# Patient Record
Sex: Female | Born: 2000 | Race: White | Hispanic: No | Marital: Single | Smoking: Never smoker
Health system: Southern US, Community
[De-identification: ages and names within clinical notes are randomized; demographics above are authoritative.]

## PROBLEM LIST (undated history)

## (undated) DIAGNOSIS — H539 Unspecified visual disturbance: Secondary | ICD-10-CM

## (undated) DIAGNOSIS — F419 Anxiety disorder, unspecified: Secondary | ICD-10-CM

## (undated) DIAGNOSIS — T7840XA Allergy, unspecified, initial encounter: Secondary | ICD-10-CM

---
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
## 2020-03-25 IMAGING — CT CT RENAL STONE PROTOCOL
2 of 4 series · 16 of 46 positions shown, 18 images · non-contrast
Comparison: None.

CLINICAL DATA: Left flank pain, antibiotics for UTI

EXAM:
CT ABDOMEN AND PELVIS WITHOUT CONTRAST
TECHNIQUE: Multidetector CT imaging of the abdomen and pelvis was performed
following the standard protocol without IV contrast.

[Series 3: axial st · axial · 0.64mm/px · z∈[-420,-54]mm · 13 of 84 slices shown, 15 images]
[im 7/84  soft-tissue]
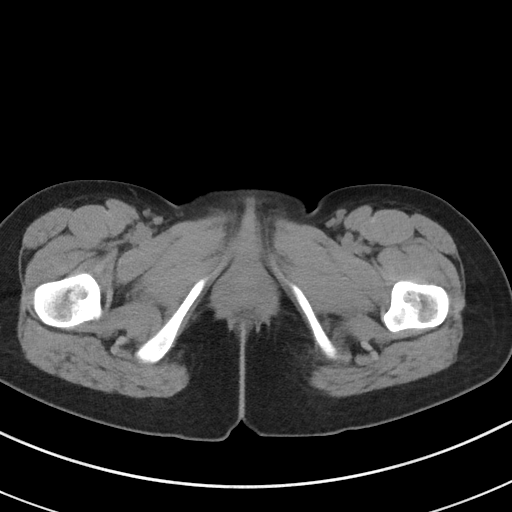
[im 7/84  bone]
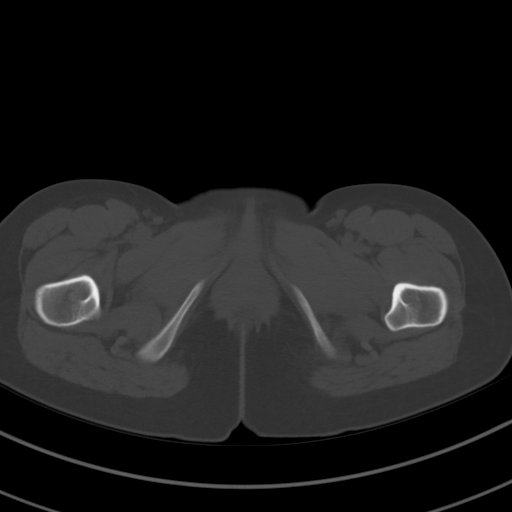
[im 13/84  soft-tissue]
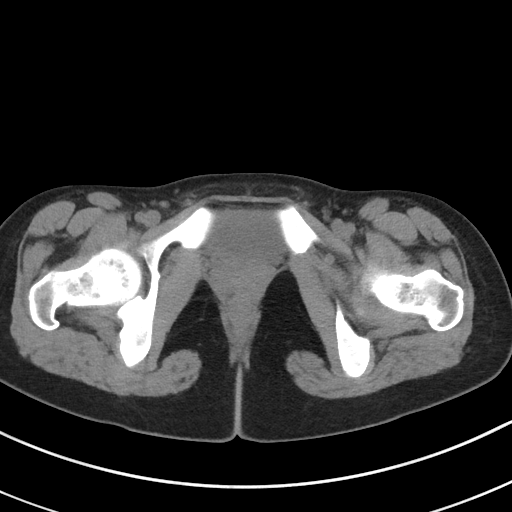
[im 19/84  soft-tissue]
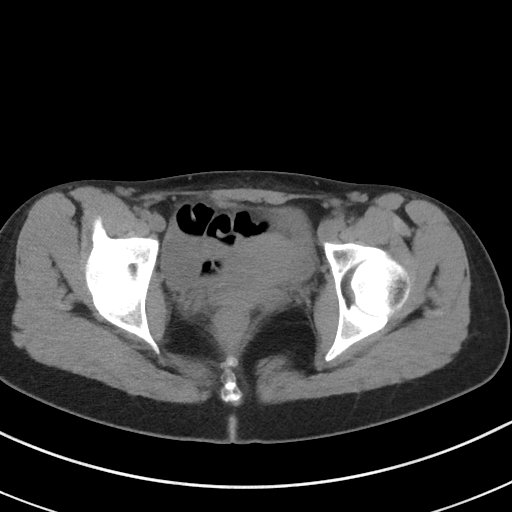
[im 25/84  soft-tissue]
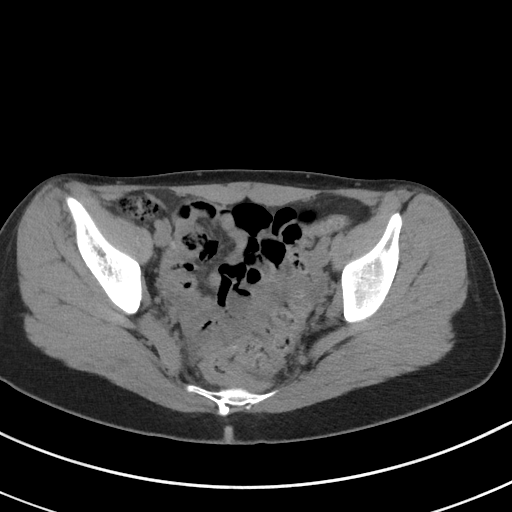
[im 31/84  soft-tissue]
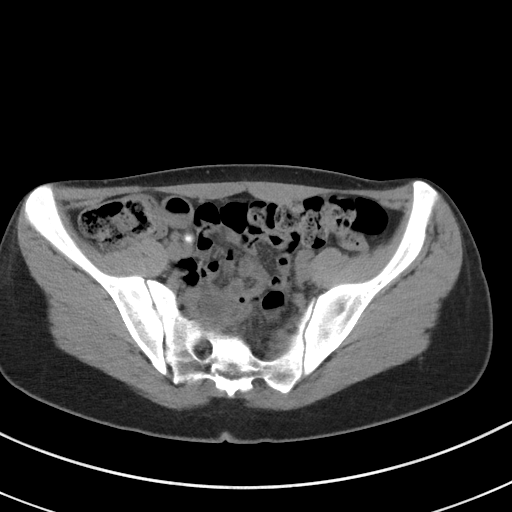
[im 37/84  soft-tissue]
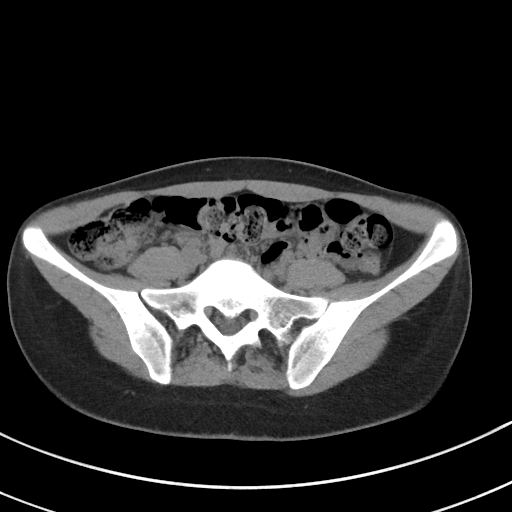
[im 44/84  soft-tissue]
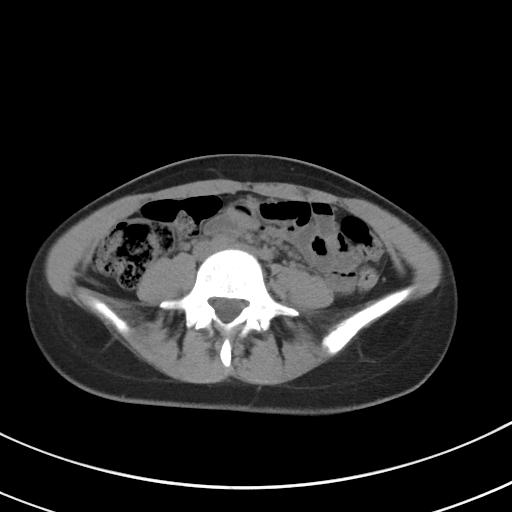
[im 50/84  soft-tissue]
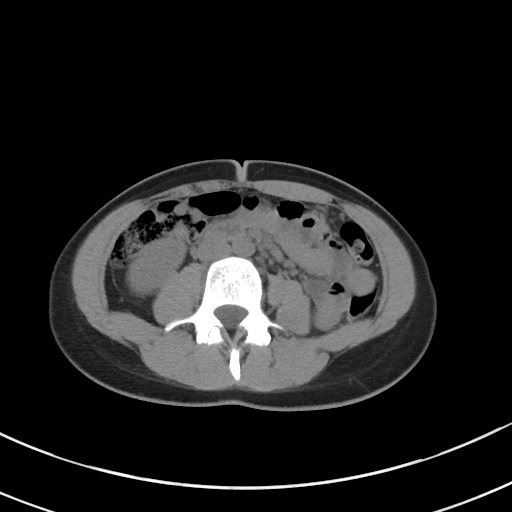
[im 56/84  soft-tissue]
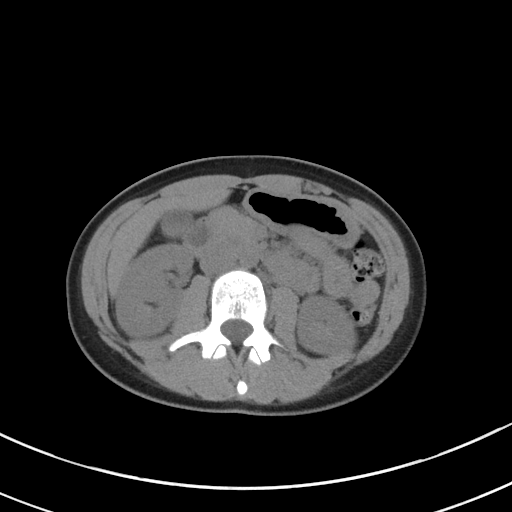
[im 56/84  bone]
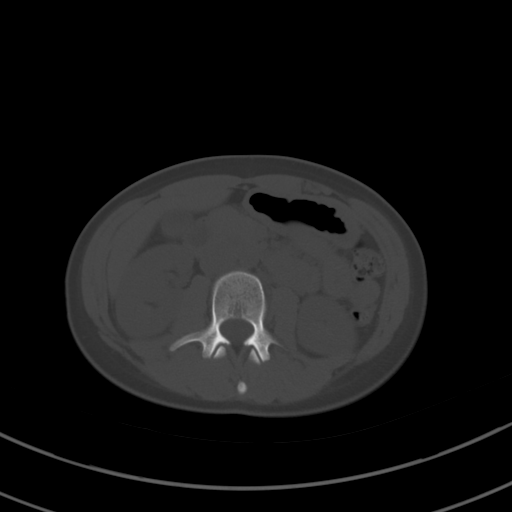
[im 62/84  soft-tissue]
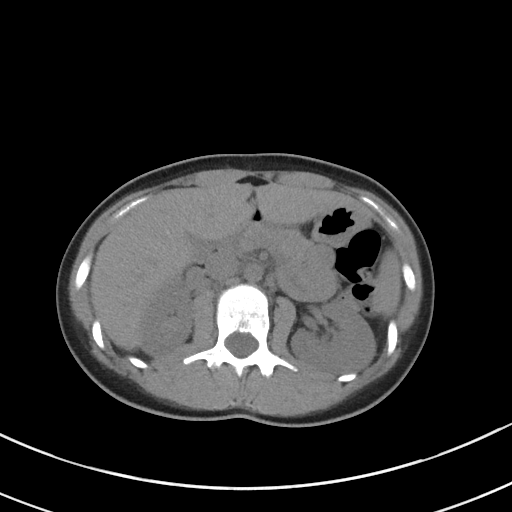
[im 68/84  soft-tissue]
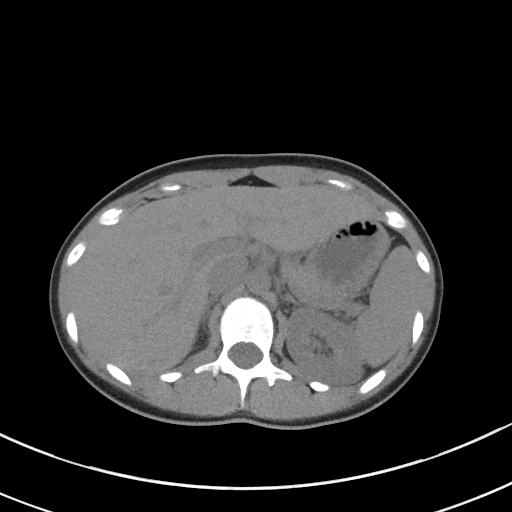
[im 74/84  soft-tissue]
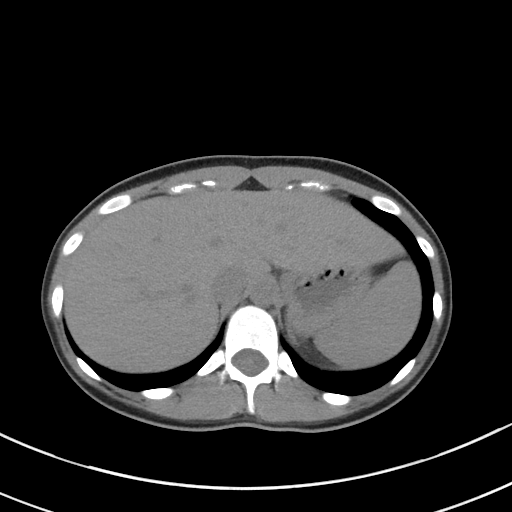
[im 80/84  soft-tissue]
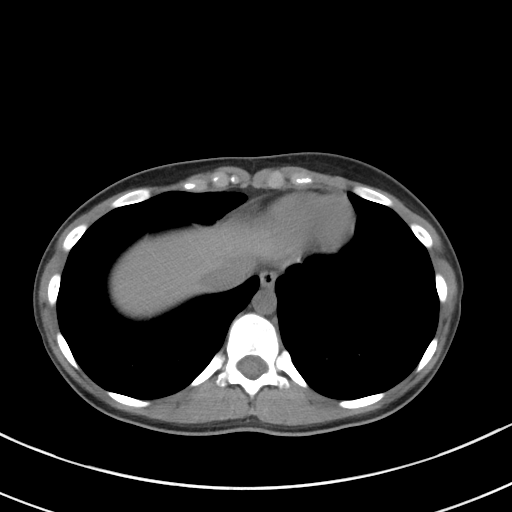

[Series 6: coronal st · coronal · 0.70mm/px · 3 of 64 slices shown]
[im 22/64  soft-tissue]
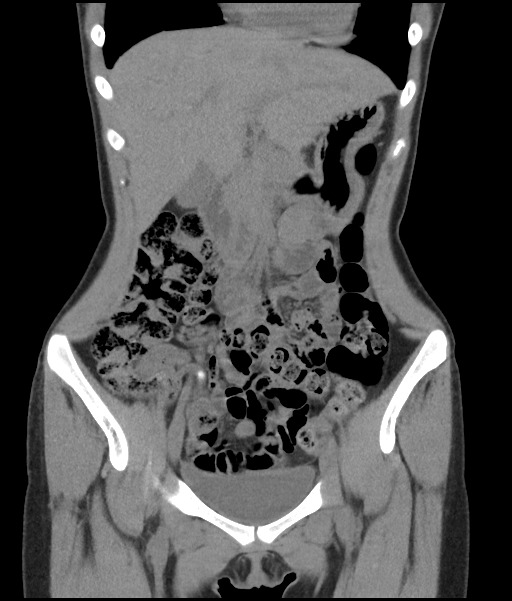
[im 29/64  soft-tissue]
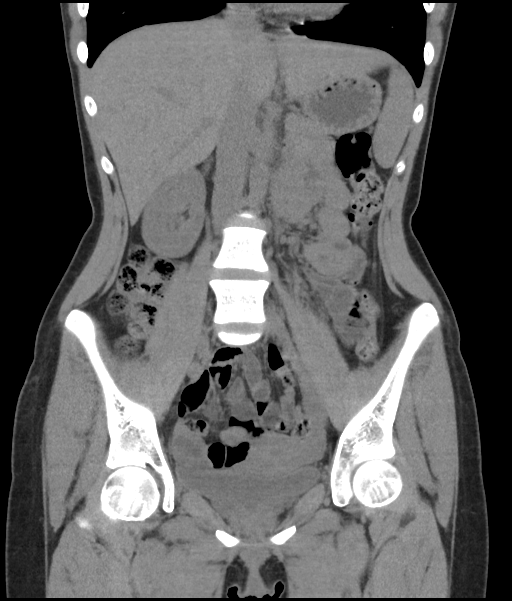
[im 36/64  soft-tissue]
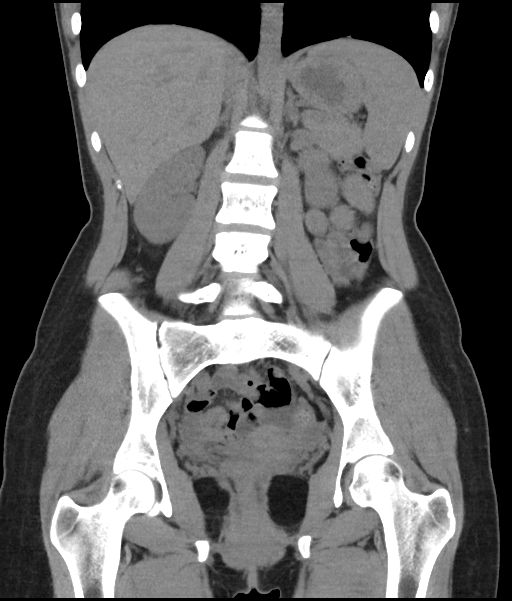

[16 of 46 positions shown; findings below may reference images not displayed]

FINDINGS: Lower chest: No acute abnormality.

Hepatobiliary: No solid liver abnormality is seen. No gallstones,
gallbladder wall thickening, or biliary dilatation.

Pancreas: Unremarkable. No pancreatic ductal dilatation or
surrounding inflammatory changes.

Spleen: Normal in size without significant abnormality.

Adrenals/Urinary Tract: Adrenal glands are unremarkable. Kidneys are
normal, without renal calculi, solid lesion, or hydronephrosis.
Bladder is unremarkable.

Stomach/Bowel: Stomach is within normal limits. High density
ingested material or appendicoliths in the appendix (series 6, image
23, series 7, image 42). No evidence of bowel wall thickening,
distention, or inflammatory changes.

Vascular/Lymphatic: No significant vascular findings are present. No
enlarged abdominal or pelvic lymph nodes.

Reproductive: No mass or other significant abnormality.

Other: No abdominal wall hernia or abnormality. No abdominopelvic
ascites.

Musculoskeletal: No acute or significant osseous findings.
IMPRESSION: 1. No non-contrast CT findings to explain left flank pain. No
evidence of urinary tract calculus or hydronephrosis.

2. High density ingested material or appendicoliths in the appendix
(series 6, image 23, series 7, image 42). No evidence of acute
appendicitis.
